# Patient Record
Sex: Female | Born: 1980 | Hispanic: Yes | Smoking: Never smoker
Health system: Southern US, Community
[De-identification: ages and names within clinical notes are randomized; demographics above are authoritative.]

## PROBLEM LIST (undated history)

## (undated) DIAGNOSIS — D496 Neoplasm of unspecified behavior of brain: Secondary | ICD-10-CM

## (undated) DIAGNOSIS — E079 Disorder of thyroid, unspecified: Secondary | ICD-10-CM

## (undated) HISTORY — PX: CHOLECYSTECTOMY: SHX55

## (undated) HISTORY — PX: APPENDECTOMY: SHX54

---

## 2014-04-19 ENCOUNTER — Encounter (HOSPITAL_COMMUNITY): Payer: Self-pay | Admitting: Emergency Medicine

## 2014-04-19 ENCOUNTER — Emergency Department (HOSPITAL_COMMUNITY)
Admission: EM | Admit: 2014-04-19 | Discharge: 2014-04-19 | Disposition: A | Discharge status: Home / Self Care | Payer: Self-pay | Attending: Emergency Medicine | Admitting: Emergency Medicine

## 2014-04-19 DIAGNOSIS — J029 Acute pharyngitis, unspecified: Secondary | ICD-10-CM | POA: Insufficient documentation

## 2014-04-19 DIAGNOSIS — Z8639 Personal history of other endocrine, nutritional and metabolic disease: Secondary | ICD-10-CM | POA: Insufficient documentation

## 2014-04-19 DIAGNOSIS — Z862 Personal history of diseases of the blood and blood-forming organs and certain disorders involving the immune mechanism: Secondary | ICD-10-CM | POA: Insufficient documentation

## 2014-04-19 HISTORY — DX: Disorder of thyroid, unspecified: E07.9

## 2014-04-19 LAB — RAPID STREP SCREEN (MED CTR MEBANE ONLY): Streptococcus, Group A Screen (Direct): NEGATIVE

## 2014-04-19 MED ORDER — HYDROCODONE-ACETAMINOPHEN 7.5-325 MG/15ML PO SOLN: 15.0000 mL | Freq: Three times a day (TID) | ORAL | Status: DC | PRN

## 2014-04-19 MED ORDER — AMOXICILLIN 500 MG PO CAPS: 500.0000 mg | ORAL_CAPSULE | Freq: Three times a day (TID) | ORAL | Status: DC

## 2014-04-19 NOTE — ED Provider Notes (Signed)
CSN: [id number]     Arrival date & time 04/19/14  1055 History  This chart was scribed for Margarita Mail, PA-C, non-physician practitioner working with Jasper Riling. Alvino Chapel, MD by Vernell Barrier, ED scribe. This patient was seen in room TR06C/TR06C and the patient's care was started at 1:02 PM.    Chief Complaint  Patient presents with  . Sore Throat   The history is provided by the patient and the spouse. A language interpreter was used.   HPI Comments: Kathryn Green is a 33 y.o. female who presents to the Emergency Department complaining of gradually worsening sore throat for the past 2 weeks; worsening as of 8 days ago. Reports difficulty drinking water.Pain with air against the back of the throat. Pain with swallowing but is able to. States lymph nodes in her neck are tender. Been managing pain with ibuprofen up to 5 pills daily with minimal to no relief. No hx of strep throat.  Past Medical History  Diagnosis Date  . Thyroid disease    Past Surgical History  Procedure Laterality Date  . Kidney stone surgery    . Cesarean section     No family history on file. History  Substance Use Topics  . Smoking status: Never Smoker   . Smokeless tobacco: Not on file  . Alcohol Use: No   OB History   Grav Para Term Preterm Abortions TAB SAB Ect Mult Living                 Review of Systems  Constitutional: Negative for fever and chills.  HENT: Positive for sore throat. Negative for congestion, dental problem, drooling, ear pain, mouth sores and trouble swallowing.   Respiratory: Negative for shortness of breath.   Cardiovascular: Negative for chest pain.  Gastrointestinal: Negative for nausea, vomiting, abdominal pain, diarrhea and constipation.  Genitourinary: Negative for dysuria and hematuria.  Musculoskeletal: Negative for arthralgias and myalgias.  Skin: Negative for rash.  Neurological: Negative for numbness.  All other systems reviewed and are negative.  Allergies   Review of patient's allergies indicates no known allergies.  Home Medications   Prior to Admission medications   Not on File   Triage vitals: BP 133/95  Pulse 107  Temp(Src) 98.5 F (36.9 C)  Resp 18  Ht 5\' 2"  (1.575 m)  Wt 200 lb (90.719 kg)  BMI 36.57 kg/m2  SpO2 98%  Physical Exam  Nursing note and vitals reviewed. Constitutional: She is oriented to person, place, and time. She appears well-developed and well-nourished. No distress.  HENT:  Head: Normocephalic and atraumatic.  Mouth/Throat: Uvula is midline. No trismus in the jaw. Uvula swelling present. Posterior oropharyngeal erythema present. No oropharyngeal exudate.  Swollen uvula and bilateral tonsillar adenopathy. No exudate. Erythema into posterior orophyranx. No trismus. No uvula deviation.   Eyes: Conjunctivae and EOM are normal.  Neck: Neck supple.  Cardiovascular: Normal rate, regular rhythm and normal heart sounds.   No murmur heard. Pulmonary/Chest: Effort normal and breath sounds normal. No respiratory distress. She has no wheezes. She has no rales.  Musculoskeletal: Normal range of motion.  Lymphadenopathy:    She has cervical adenopathy (painful, anterior).  Neurological: She is alert and oriented to person, place, and time.  Skin: Skin is warm and dry.  Psychiatric: She has a normal mood and affect. Her behavior is normal.    ED Course  Procedures (including critical care time) DIAGNOSTIC STUDIES: Oxygen Saturation is 98% on room air, normal by my interpretation.  COORDINATION OF CARE: At 1:09 PM: Discussed treatment plan with patient which includes treatment with antibiotics and pain medication. Encouraged to drink hot liquids and soups to soothe the throat. Patient agrees.    Labs Review Results for orders placed during the hospital encounter of 04/19/14  RAPID STREP SCREEN      Result Value Ref Range   Streptococcus, Group A Screen (Direct) NEGATIVE  NEGATIVE     Imaging Review No  results found.   EKG Interpretation None      MDM   Final diagnoses:  Pharyngitis   patient with pharyngitis persistent for to weeks .will treat with abx due to length of sxs.  f/u with pcp. Pain meds at discharge. Tolerating secretions. Return precautions discussed.  I personally performed the services described in this documentation, which was scribed in my presence. The recorded information has been reviewed and is accurate.     Margarita Mail, PA-C 04/21/14 2207

## 2014-04-19 NOTE — Discharge Instructions (Signed)

## 2014-04-19 NOTE — ED Notes (Signed)
Pt c/o sore throat for 8 days. Pt has tried ibuprofen for 1 week.

## 2014-04-21 LAB — CULTURE, GROUP A STREP

## 2014-04-22 NOTE — ED Provider Notes (Signed)
Medical screening examination/treatment/procedure(s) were performed by non-physician practitioner and as supervising physician I was immediately available for consultation/collaboration.   EKG Interpretation None       Jasper Riling. Alvino Chapel, MD 04/22/14 1327

## 2014-10-21 ENCOUNTER — Emergency Department (HOSPITAL_COMMUNITY): Payer: Self-pay

## 2014-10-21 ENCOUNTER — Emergency Department (HOSPITAL_COMMUNITY)
Admission: EM | Admit: 2014-10-21 | Discharge: 2014-10-21 | Disposition: A | Discharge status: Home / Self Care | Payer: Self-pay | Attending: Emergency Medicine | Admitting: Emergency Medicine

## 2014-10-21 ENCOUNTER — Encounter (HOSPITAL_COMMUNITY): Payer: Self-pay | Admitting: *Deleted

## 2014-10-21 DIAGNOSIS — M546 Pain in thoracic spine: Secondary | ICD-10-CM

## 2014-10-21 DIAGNOSIS — R0789 Other chest pain: Secondary | ICD-10-CM | POA: Insufficient documentation

## 2014-10-21 DIAGNOSIS — Y9389 Activity, other specified: Secondary | ICD-10-CM | POA: Insufficient documentation

## 2014-10-21 DIAGNOSIS — S3991XA Unspecified injury of abdomen, initial encounter: Secondary | ICD-10-CM | POA: Insufficient documentation

## 2014-10-21 DIAGNOSIS — Y998 Other external cause status: Secondary | ICD-10-CM | POA: Insufficient documentation

## 2014-10-21 DIAGNOSIS — S199XXA Unspecified injury of neck, initial encounter: Secondary | ICD-10-CM | POA: Insufficient documentation

## 2014-10-21 DIAGNOSIS — R1011 Right upper quadrant pain: Secondary | ICD-10-CM

## 2014-10-21 DIAGNOSIS — Z3202 Encounter for pregnancy test, result negative: Secondary | ICD-10-CM | POA: Insufficient documentation

## 2014-10-21 DIAGNOSIS — Z9049 Acquired absence of other specified parts of digestive tract: Secondary | ICD-10-CM | POA: Insufficient documentation

## 2014-10-21 DIAGNOSIS — S299XXA Unspecified injury of thorax, initial encounter: Secondary | ICD-10-CM | POA: Insufficient documentation

## 2014-10-21 DIAGNOSIS — Z8639 Personal history of other endocrine, nutritional and metabolic disease: Secondary | ICD-10-CM | POA: Insufficient documentation

## 2014-10-21 DIAGNOSIS — Y9289 Other specified places as the place of occurrence of the external cause: Secondary | ICD-10-CM | POA: Insufficient documentation

## 2014-10-21 DIAGNOSIS — W1839XA Other fall on same level, initial encounter: Secondary | ICD-10-CM | POA: Insufficient documentation

## 2014-10-21 LAB — CBC WITH DIFFERENTIAL/PLATELET
BASOS ABS: 0 10*3/uL (ref 0.0–0.1)
BASOS PCT: 0 % (ref 0–1)
EOS ABS: 0 10*3/uL (ref 0.0–0.7)
Eosinophils Relative: 0 % (ref 0–5)
HEMATOCRIT: 36.1 % (ref 36.0–46.0)
Hemoglobin: 12.3 g/dL (ref 12.0–15.0)
LYMPHS PCT: 32 % (ref 12–46)
Lymphs Abs: 2.3 10*3/uL (ref 0.7–4.0)
MCH: 31.3 pg (ref 26.0–34.0)
MCHC: 34.1 g/dL (ref 30.0–36.0)
MCV: 91.9 fL (ref 78.0–100.0)
MONO ABS: 0.4 10*3/uL (ref 0.1–1.0)
MONOS PCT: 6 % (ref 3–12)
NEUTROS PCT: 62 % (ref 43–77)
Neutro Abs: 4.5 10*3/uL (ref 1.7–7.7)
Platelets: 298 10*3/uL (ref 150–400)
RBC: 3.93 MIL/uL (ref 3.87–5.11)
RDW: 12.9 % (ref 11.5–15.5)
WBC: 7.3 10*3/uL (ref 4.0–10.5)

## 2014-10-21 LAB — LIPASE, BLOOD: LIPASE: 33 U/L (ref 11–59)

## 2014-10-21 LAB — URINALYSIS, ROUTINE W REFLEX MICROSCOPIC
Bilirubin Urine: NEGATIVE
Glucose, UA: NEGATIVE mg/dL
Hgb urine dipstick: NEGATIVE
Ketones, ur: NEGATIVE mg/dL
LEUKOCYTES UA: NEGATIVE
NITRITE: NEGATIVE
PH: 5.5 (ref 5.0–8.0)
Protein, ur: NEGATIVE mg/dL
Specific Gravity, Urine: 1.025 (ref 1.005–1.030)
Urobilinogen, UA: 0.2 mg/dL (ref 0.0–1.0)

## 2014-10-21 LAB — COMPREHENSIVE METABOLIC PANEL
ALBUMIN: 4.1 g/dL (ref 3.5–5.2)
ALT: 38 U/L — ABNORMAL HIGH (ref 0–35)
ANION GAP: 7 (ref 5–15)
AST: 43 U/L — AB (ref 0–37)
Alkaline Phosphatase: 89 U/L (ref 39–117)
BILIRUBIN TOTAL: 2 mg/dL — AB (ref 0.3–1.2)
BUN: 14 mg/dL (ref 6–23)
CALCIUM: 8.7 mg/dL (ref 8.4–10.5)
CO2: 28 mmol/L (ref 19–32)
Chloride: 105 mEq/L (ref 96–112)
Creatinine, Ser: 0.73 mg/dL (ref 0.50–1.10)
GFR calc Af Amer: >90 mL/min (ref >90)
GFR calc non Af Amer: >90 mL/min (ref >90)
Glucose, Bld: 95 mg/dL (ref 70–99)
POTASSIUM: 3.1 mmol/L — AB (ref 3.5–5.1)
SODIUM: 140 mmol/L (ref 135–145)
Total Protein: 7.8 g/dL (ref 6.0–8.3)

## 2014-10-21 LAB — POC URINE PREG, ED: Preg Test, Ur: NEGATIVE

## 2014-10-21 MED ORDER — HYDROMORPHONE HCL 1 MG/ML IJ SOLN
1.0000 mg | Freq: Once | INTRAMUSCULAR | Status: AC
  Administered 2014-10-21: 1 mg via INTRAMUSCULAR
  Filled 2014-10-21: qty 1

## 2014-10-21 MED ORDER — ONDANSETRON 4 MG PO TBDP
4.0000 mg | ORAL_TABLET | Freq: Once | ORAL | Status: AC
  Administered 2014-10-21: 4 mg via ORAL
  Filled 2014-10-21: qty 1

## 2014-10-21 MED ORDER — HYDROMORPHONE HCL 1 MG/ML IJ SOLN
1.0000 mg | Freq: Once | INTRAMUSCULAR | Status: AC
  Administered 2014-10-21: 1 mg via INTRAVENOUS
  Filled 2014-10-21: qty 1

## 2014-10-21 MED ORDER — ONDANSETRON HCL 4 MG/2ML IJ SOLN
4.0000 mg | Freq: Once | INTRAMUSCULAR | Status: DC
  Filled 2014-10-21: qty 2

## 2014-10-21 MED ORDER — MELOXICAM 15 MG PO TABS
15.0000 mg | ORAL_TABLET | Freq: Every day | ORAL | Status: AC
Start: 2014-10-21 — End: ?

## 2014-10-21 MED ORDER — ONDANSETRON HCL 4 MG/2ML IJ SOLN
4.0000 mg | Freq: Once | INTRAMUSCULAR | Status: AC
  Administered 2014-10-21: 4 mg via INTRAVENOUS

## 2014-10-21 MED ORDER — HYDROCODONE-ACETAMINOPHEN 7.5-325 MG PO TABS: 1.0000 | ORAL_TABLET | ORAL | Status: DC | PRN

## 2014-10-21 MED ORDER — BACLOFEN 10 MG PO TABS: 10.0000 mg | ORAL_TABLET | Freq: Three times a day (TID) | ORAL | Status: AC

## 2014-10-21 NOTE — ED Provider Notes (Signed)
Medical screening examination/treatment/procedure(s) were performed by non-physician practitioner and as supervising physician I was immediately available for consultation/collaboration.   EKG Interpretation None        Fredia Sorrow, MD 10/21/14 [phone]

## 2014-10-21 NOTE — Care Management Note (Signed)
ED/CM noted patient did not have health insurance and/or PCP listed in the computer.  Patient was given the [facility] with information on the clinics, food pantries, and the handout for new health insurance sign-up. Pt was also given a Rx discount card. Patient expressed appreciation for information received.

## 2014-10-21 NOTE — ED Provider Notes (Signed)
CSN: [id number]     Arrival date & time 10/21/14  3810 History   First MD Initiated Contact with Patient 10/21/14 [phone]     Chief Complaint  Patient presents with  . Abdominal Pain     (Consider location/radiation/quality/duration/timing/severity/associated sxs/prior Treatment) HPI Comments:  Patient is a 34 year old female who presents to the emergency department with complaint of right rib, abdomen, and back pain. The patient states that she fell from a standing position 4 days ago and injured this area. She's been having increasing pain since that time. She denies vomiting, and denies difficulty with breathing. She denies any changes in her stool, or urine, with her last bowel movement being yesterday January 19. The patient denies being on any anticoagulation medications. She's not had any operations or procedures involving the right chest or back. The pain is made worse with movement, particularly changing positions. Nothing seems to make the pain any better.  Patient is a 34 y.o. female presenting with chest pain. The history is provided by the patient.  Chest Pain Pain location:  R chest Pain radiates to:  Mid back Associated symptoms: abdominal pain   Associated symptoms: no back pain, no cough, no dizziness, no palpitations and no shortness of breath     Past Medical History  Diagnosis Date  . Thyroid disease    Past Surgical History  Procedure Laterality Date  . Kidney stone surgery    . Cesarean section    . Cholecystectomy     No family history on file. History  Substance Use Topics  . Smoking status: Never Smoker   . Smokeless tobacco: Not on file  . Alcohol Use: No   OB History    No data available     Review of Systems  Constitutional: Negative for activity change.       All ROS Neg except as noted in HPI  HENT: Negative.   Eyes: Negative for photophobia and discharge.  Respiratory: Negative for cough, shortness of breath and wheezing.   Cardiovascular:  Positive for chest pain. Negative for palpitations.  Gastrointestinal: Positive for abdominal pain. Negative for blood in stool.  Genitourinary: Negative for dysuria, frequency and hematuria.  Musculoskeletal: Negative for back pain, arthralgias and neck pain.  Skin: Negative.   Neurological: Negative for dizziness, seizures and speech difficulty.  Psychiatric/Behavioral: Negative for hallucinations and confusion.      Allergies  Review of patient's allergies indicates no known allergies.  Home Medications   Prior to Admission medications   Medication Sig Start Date End Date Taking? Authorizing Provider  ibuprofen (ADVIL,MOTRIN) 200 MG tablet Take 800 mg by mouth daily as needed for moderate pain.    Yes Historical Provider, MD  amoxicillin (AMOXIL) 500 MG capsule Take 1 capsule (500 mg total) by mouth 3 (three) times daily. Patient not taking: Reported on 10/21/2014 04/19/14   Margarita Mail, PA-C  HYDROcodone-acetaminophen (HYCET) 7.5-325 mg/15 ml solution Take 15 mLs by mouth every 8 (eight) hours as needed for moderate pain. Patient not taking: Reported on 10/21/2014 04/19/14   Margarita Mail, PA-C   BP 148/100 mmHg  Pulse 80  Temp(Src) 98.5 F (36.9 C) (Oral)  Resp 20  Ht 5\' 2"  (1.575 m)  Wt 200 lb (90.719 kg)  BMI 36.57 kg/m2  SpO2 99% Physical Exam  Constitutional: She is oriented to person, place, and time. She appears well-developed and well-nourished.  Non-toxic appearance.  HENT:  Head: Normocephalic.  Right Ear: Tympanic membrane and external ear normal.  Left Ear:  Tympanic membrane and external ear normal.  Eyes: EOM and lids are normal. Pupils are equal, round, and reactive to light.  Neck: Normal range of motion. Neck supple. Carotid bruit is not present.  Cardiovascular: Normal rate, regular rhythm, normal heart sounds, intact distal pulses and normal pulses.   Pulmonary/Chest: Breath sounds normal. No respiratory distress. She has no wheezes. She has no rales.  She exhibits tenderness.  Pain to palpation of the right anterior rib area, flank rib area, and posterior rib area near the thoracic region.  Abdominal: Soft. Bowel sounds are normal. There is tenderness in the right upper quadrant. There is guarding.    Musculoskeletal: Normal range of motion.  Pain to palpation of the mid to lower thoracic area. Severe pain with change of position from lying down to sitting. No palpable deformity or step-off appreciated.  Lymphadenopathy:       Head (right side): No submandibular adenopathy present.       Head (left side): No submandibular adenopathy present.    She has no cervical adenopathy.  Neurological: She is alert and oriented to person, place, and time. She has normal strength. No cranial nerve deficit or sensory deficit.  Skin: Skin is warm and dry.  Psychiatric: She has a normal mood and affect. Her speech is normal.  Nursing note and vitals reviewed.   ED Course  Procedures (including critical care time) Labs Review Labs Reviewed  URINALYSIS, ROUTINE W REFLEX MICROSCOPIC  POC URINE PREG, ED    Imaging Review No results found.   EKG Interpretation None      MDM  Vital signs non-acute. Cbc wnl. Cmet reveals low potassium of 3.1, Liver function elevated.. Lipase wnl. Urine wnl. No acute biliary problem on ultrasound. CT chest - No rib fx or thoracic fracture.  Test results given to the patient. Rx for baclofen, mobic, and norco given to the patient. Pt to follow up with her PCP.   Final diagnoses:  Thoracic back pain, unspecified back pain laterality    *I have reviewed nursing notes, vital signs, and all appropriate lab and imaging results for this patient.[address] Pryor Montes, PA-C 10/23/14 1734  Fredia Sorrow, MD 10/27/14 [phone]

## 2014-10-21 NOTE — ED Notes (Signed)
Pt fell Saturday and pain has been getting worse since then. Pt has gallbladder removal several years ago and pain hurts in that same area. Pt states it hurts to move. Pt states her stool has been yellow since Saturday, with last BM yesterday. NAD noted.

## 2014-10-27 ENCOUNTER — Emergency Department (HOSPITAL_COMMUNITY)
Admission: EM | Admit: 2014-10-27 | Discharge: 2014-10-27 | Discharge status: Against Medical Advice | Payer: Self-pay | Attending: Emergency Medicine | Admitting: Emergency Medicine

## 2014-10-27 ENCOUNTER — Encounter (HOSPITAL_COMMUNITY): Payer: Self-pay | Admitting: *Deleted

## 2014-10-27 DIAGNOSIS — R1011 Right upper quadrant pain: Secondary | ICD-10-CM | POA: Insufficient documentation

## 2014-10-27 NOTE — ED Notes (Signed)
Pt called for room with no response x3.

## 2014-10-27 NOTE — ED Notes (Signed)
Pt called for bed placement x1 with no response.

## 2014-10-27 NOTE — ED Notes (Signed)
Called second time for a room. No answer.

## 2014-10-27 NOTE — ED Notes (Signed)
Right upper quadrant pain for 2 months

## 2015-12-19 ENCOUNTER — Encounter (HOSPITAL_COMMUNITY): Payer: Self-pay | Admitting: *Deleted

## 2015-12-19 ENCOUNTER — Emergency Department (HOSPITAL_COMMUNITY)
Admission: EM | Admit: 2015-12-19 | Discharge: 2015-12-19 | Disposition: A | Discharge status: Home / Self Care | Payer: Self-pay | Attending: Emergency Medicine | Admitting: Emergency Medicine

## 2015-12-19 DIAGNOSIS — H9202 Otalgia, left ear: Secondary | ICD-10-CM

## 2015-12-19 DIAGNOSIS — J069 Acute upper respiratory infection, unspecified: Secondary | ICD-10-CM | POA: Insufficient documentation

## 2015-12-19 MED ORDER — AMOXICILLIN 250 MG PO CAPS
500.0000 mg | ORAL_CAPSULE | Freq: Once | ORAL | Status: AC
  Administered 2015-12-19: 500 mg via ORAL
  Filled 2015-12-19: qty 2

## 2015-12-19 MED ORDER — AMOXICILLIN 500 MG PO CAPS: 500.0000 mg | ORAL_CAPSULE | Freq: Three times a day (TID) | ORAL | Status: AC

## 2015-12-19 MED ORDER — ACETAMINOPHEN-CODEINE #3 300-30 MG PO TABS: 1.0000 | ORAL_TABLET | Freq: Four times a day (QID) | ORAL | Status: AC | PRN

## 2015-12-19 MED ORDER — IBUPROFEN 600 MG PO TABS: 600.0000 mg | ORAL_TABLET | Freq: Four times a day (QID) | ORAL | Status: DC | PRN

## 2015-12-19 MED ORDER — IBUPROFEN 800 MG PO TABS
800.0000 mg | ORAL_TABLET | Freq: Once | ORAL | Status: AC
  Administered 2015-12-19: 800 mg via ORAL
  Filled 2015-12-19: qty 1

## 2015-12-19 NOTE — Discharge Instructions (Signed)
Use salt water gargles three or 4 times daily. Use amoxil three times daily with food. Use ibuprofen every 6 hours as needed for pain or fever or chills. Use tylenol codeine for severe pain. This medication may cause drowsiness, use with caution. Wash hands frequently. Dolor de odos (Earache) El dolor de odos, tambin llamado otalgia, puede tener muchas causas. Puede ser agudo, sordo o ardiente, transitorio o Agricultural engineer. Los dolores de odos pueden deberse a problemas de los odos, como infecciones en el odo medio o el conducto auditivo externo, lesiones, tapones de cera, presin en el odo medio o un cuerpo extrao en el odo. Tambin, a problemas en otras zonas, lo que se conoce como dolor referido. Por ejemplo, el dolor puede deberse a una faringitis, una infeccin dental o problemas de la mandbula o de la articulacin que se encuentra entre la mandbula y el crneo (articulacin temporomandibular o ATM). No siempre es fcil identificar la causa de un dolor de odos. La observacin cautelosa puede ser la [place] en el caso de algunos dolores de odos, [place] se descubra una causa clara. INSTRUCCIONES PARA EL CUIDADO EN EL HOGAR Controle su afeccin para ver si hay cambios. Las siguientes medidas pueden servir para Public house manager cualquier molestia que est sintiendo:  Tome los medicamentos solamente como se lo haya indicado el mdico. Esto incluye la aplicacin de las gotas ticas.  Pngase hielo en la oreja para ayudar a Best boy.  Ponga el hielo en una bolsa plstica.  Coloque una toalla entre la piel y la bolsa de hielo.  Coloque el hielo durante 43mnutos, 2 a 3veces por dTraining and development officer  No se ponga nada en el odo que no sean los medicamentos que eSpecial educational needs teacher  Intente descansar en posicin erguida, en lugar de recostarse. Esto puede ayudar a reducir la presin en el odo medio y aBest boy  Mastique goma de mascar si esto ayuda a aBest boyde  odos.  Controle cualquier alergia que tenga.  Concurra a todas las visitas de control como se lo haya indicado el mdico. Esto es importante. SOLICITE ATENCIN MDICA SI:  El dolor no mejora en el trmino de 2das.  Tiene fiebre.  Tiene sntomas nuevos o estos empeoran. SOLICITE ATENCIN MDICA DE INMEDIATO SI:  Sufre un dolor intenso de [place]  Presenta rigidez en el cuello.  Tiene dificultad para tragar.  Hay enrojecimiento o hinchazn detrs de la oreja.  Tiene secrecin del odo.  Tiene prdida de la audicin.  Siente mareos.   Esta informacin no tiene cMarine scientistel consejo del mdico. Asegrese de hacerle al mdico cualquier pregunta que tenga.   Document Released: 12/26/2007 Document Revised: 10/09/2014 Elsevier Interactive Patient Education [employer]

## 2015-12-19 NOTE — ED Notes (Signed)
Pt began having left ear pain starting 3 days ago. Pt states she has had some throat pain as well. NAD noted in triage.

## 2015-12-19 NOTE — ED Provider Notes (Signed)
History  By signing my name below, I, Marlowe Kays, attest that this documentation has been prepared under the direction and in the presence of Lily Kocher, PA-C. Electronically Signed: Marlowe Kays, ED Scribe. 12/19/2015. 4:15 PM.  Chief Complaint  Patient presents with  . Otalgia   The history is provided by the patient and medical records. A language interpreter was used.    HPI Comments:  Kathryn Green is a 35 y.o. female who presents to the Emergency Department complaining of left ear pain that began two days ago. She states the underside of her left jaw began yesterday. She reports associated subjective fever and sore throat. Pt reports having sick contacts of someone in her household. She has taken Tylenol for the pain with minimal relief of the pain. She denies modifying factors. She denies dental pain, nausea, vomiting, diarrhea, rash. She denies any procedures on her ears.  Past Medical History  Diagnosis Date  . Thyroid disease    Past Surgical History  Procedure Laterality Date  . Kidney stone surgery    . Cesarean section    . Cholecystectomy     No family history on file. Social History  Substance Use Topics  . Smoking status: Never Smoker   . Smokeless tobacco: None  . Alcohol Use: No   OB History    No data available     Review of Systems  HENT: Positive for congestion, ear pain and sore throat.   All other systems reviewed and are negative.   Allergies  Review of patient's allergies indicates no known allergies.  Home Medications   Prior to Admission medications   Medication Sig Start Date End Date Taking? Authorizing Provider  amoxicillin (AMOXIL) 500 MG capsule Take 1 capsule (500 mg total) by mouth 3 (three) times daily. Patient not taking: Reported on 10/21/2014 04/19/14   Margarita Mail, PA-C  HYDROcodone-acetaminophen (NORCO) 7.5-325 MG per tablet Take 1 tablet by mouth every 4 (four) hours as needed. 10/21/14   Lily Kocher, PA-C   ibuprofen (ADVIL,MOTRIN) 200 MG tablet Take 800 mg by mouth daily as needed for moderate pain.     Historical Provider, MD  meloxicam (MOBIC) 15 MG tablet Take 1 tablet (15 mg total) by mouth daily. 10/21/14   Lily Kocher, PA-C   Triage Vitals: BP 159/107 mmHg  Pulse 103  Temp(Src) 98.6 F (37 C) (Oral)  Resp 16  Ht 5\' 2"  (1.575 m)  Wt 200 lb (90.719 kg)  BMI 36.57 kg/m2  SpO2 100% Physical Exam  Constitutional: She is oriented to person, place, and time. She appears well-developed and well-nourished.  HENT:  Head: Normocephalic and atraumatic.  Right Ear: Tympanic membrane and ear canal normal.  Left Ear: Tympanic membrane is erythematous and bulging.  Mouth/Throat: Posterior oropharyngeal erythema present.  Redness and mild bulging of left TM. No redness or swelling involving right or left *mastitis**. Nasal congestion present. Mild swelling of left tonsillar area. Increased redness of posterior oropharynx.  Eyes: EOM are normal.  Neck: Normal range of motion.  Cardiovascular: Normal rate, regular rhythm and normal heart sounds.  Exam reveals no gallop and no friction rub.   No murmur heard. Pulmonary/Chest: Effort normal and breath sounds normal. No respiratory distress. She has no wheezes. She has no rales.  Musculoskeletal: Normal range of motion.  Neurological: She is alert and oriented to person, place, and time.  Skin: Skin is warm and dry. No rash noted.  Psychiatric: She has a normal mood and affect. Her behavior  is normal.  Nursing note and vitals reviewed.   ED Course  Procedures (including critical care time) DIAGNOSTIC STUDIES: Oxygen Saturation is 100% on RA, normal by my interpretation.   COORDINATION OF CARE: 4:15 PM- Encouraged pt to use salt water gargles. Will prescribe Amoxicillin, Ibuprofen and Tylenol with Codeine for severe pain. Pt verbalizes understanding and agrees to plan.  Medications - No data to display  Labs Review Labs Reviewed - No data  to display  Imaging Review No results found. I have personally reviewed and evaluated these images and lab results as part of my medical decision-making.   EKG Interpretation None      MDM Exam favors otitis media and uri. No evidence for more severe infections. Pt is awake and alert, in no distress. D/C instructions given by Spanish interpreter video assistance.   Final diagnoses:  None    **I have reviewed nursing notes, vital signs, and all appropriate lab and imaging results for this patient.*  I personally performed the services described in this documentation, which was scribed in my presence. The recorded information has been reviewed and is accurate.    Lily Kocher, PA-C 12/19/15 [place], MD 12/23/15 2232

## 2016-02-24 IMAGING — US US ABDOMEN LIMITED
1 series · 14 of 25 positions shown · non-contrast
Comparison: None.

CLINICAL DATA: History of gallbladder surgery with elevated LFTs.
Recent fall and pain is getting worse. Pain is in the right upper
quadrant.

EXAM:
US ABDOMEN LIMITED - RIGHT UPPER QUADRANT

[Series 1: us abdomen limited · 0.23mm/px · 14 of 51 slices shown]
[im 1/51]
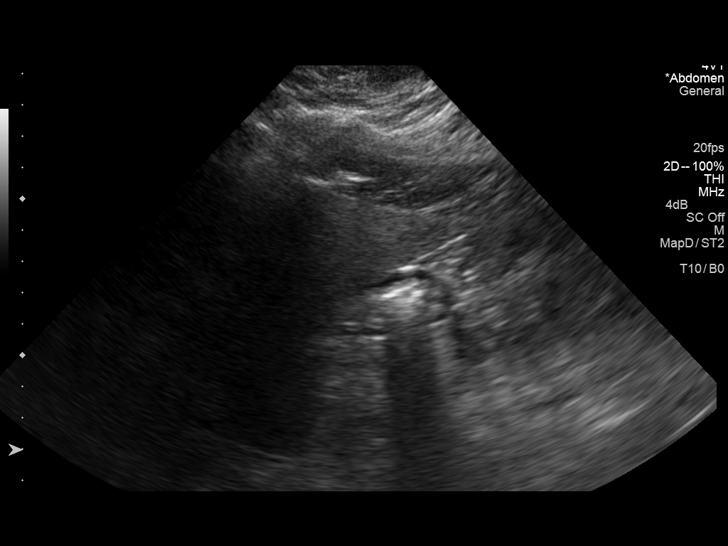
[im 5/51]
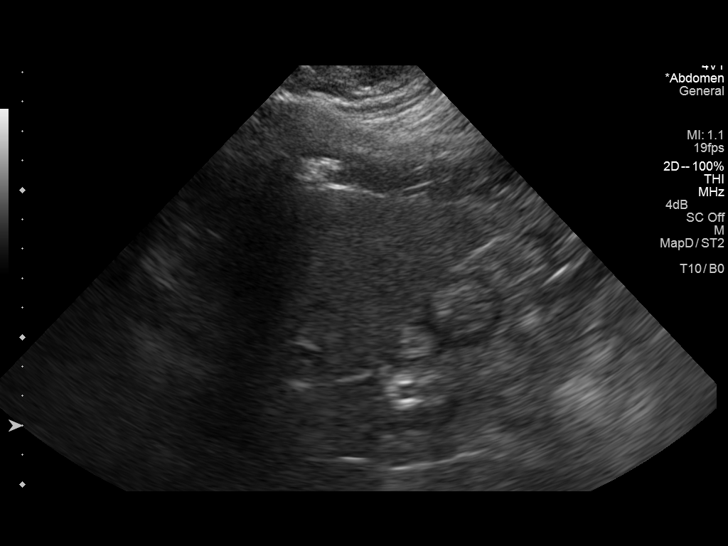
[im 9/51]
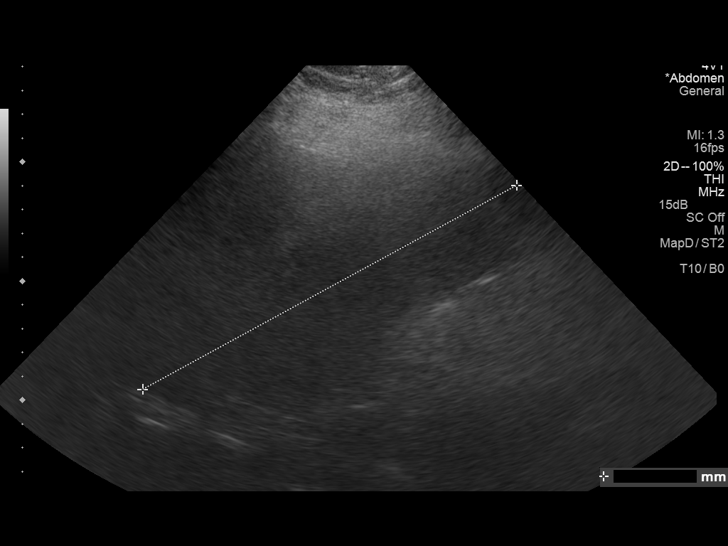
[im 13/51]
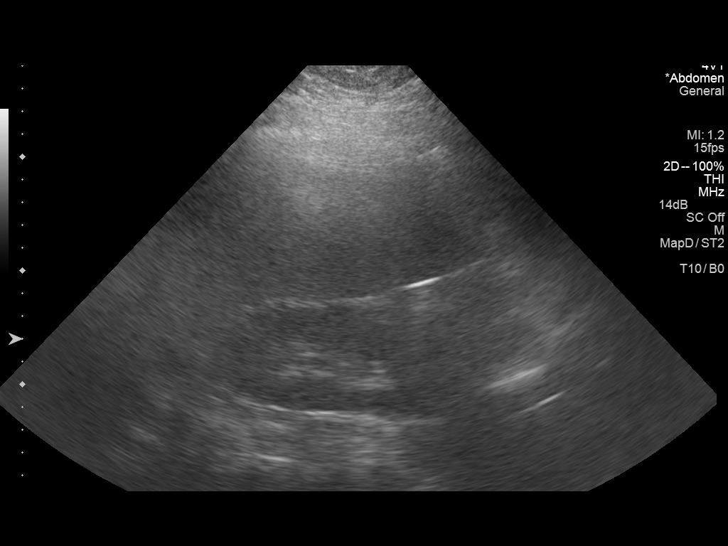
[im 17/51]
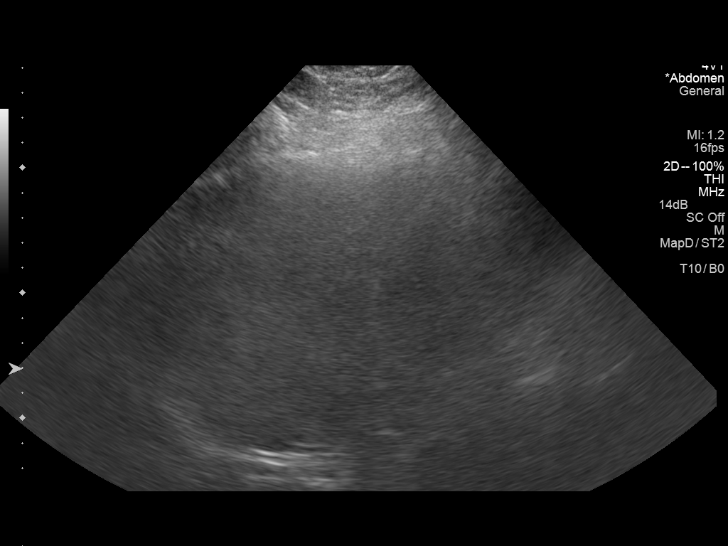
[im 19/51]
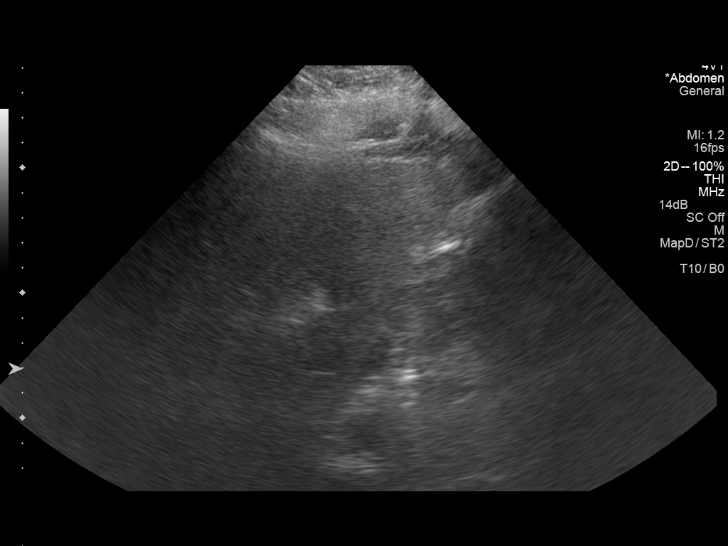
[im 23/51]
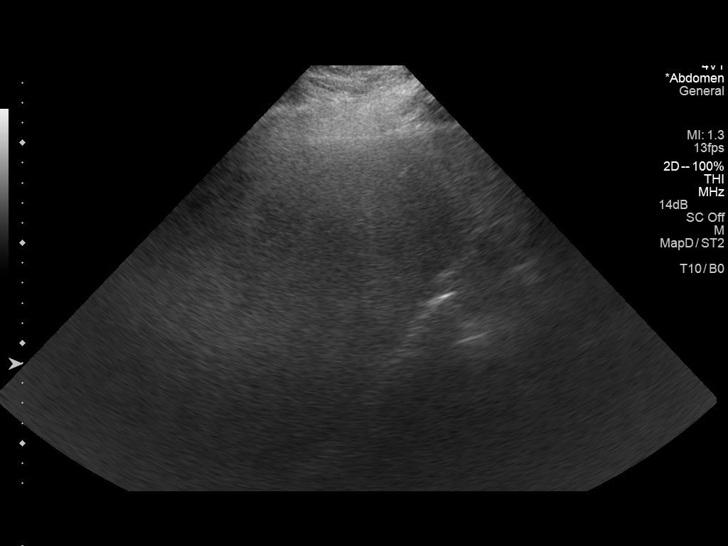
[im 28/51]
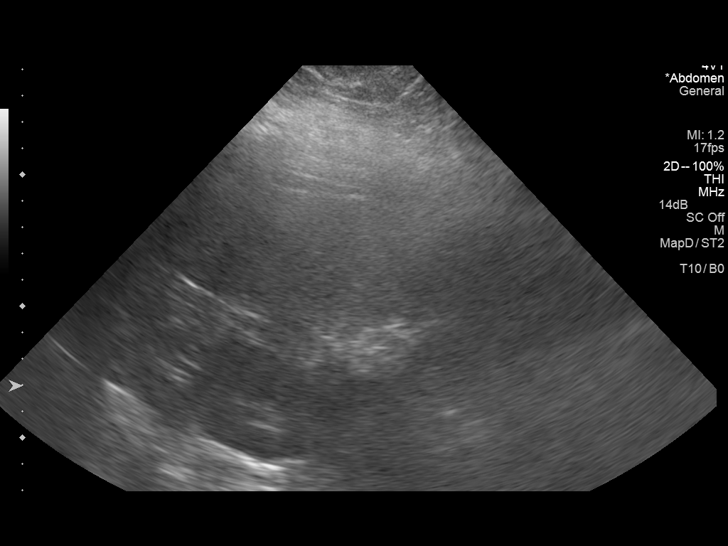
[im 32/51]
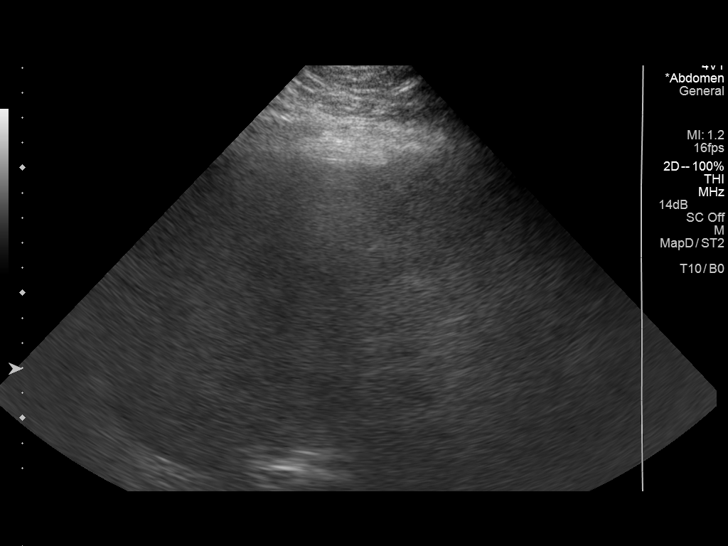
[im 34/51]
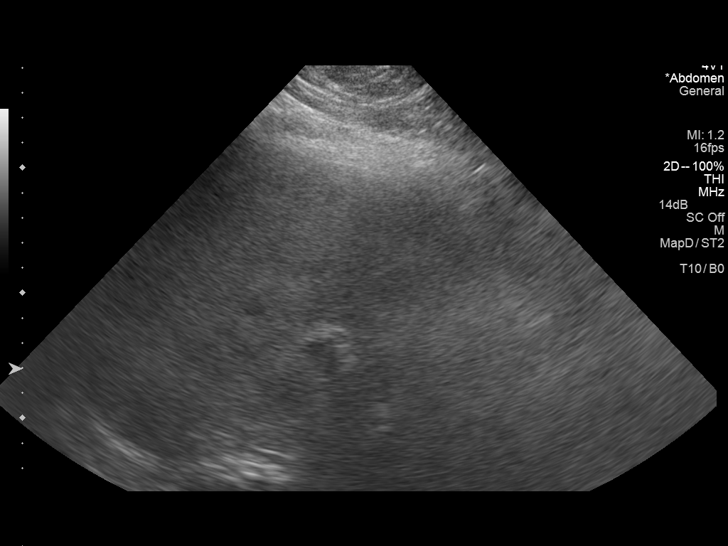
[im 38/51]
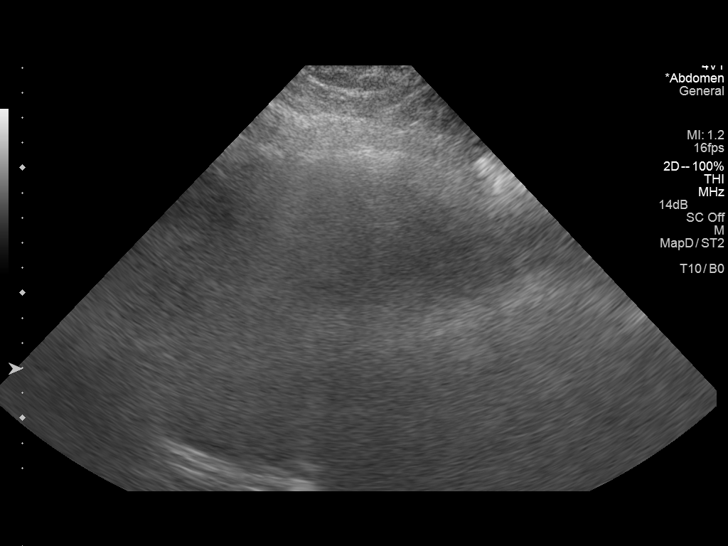
[im 42/51]
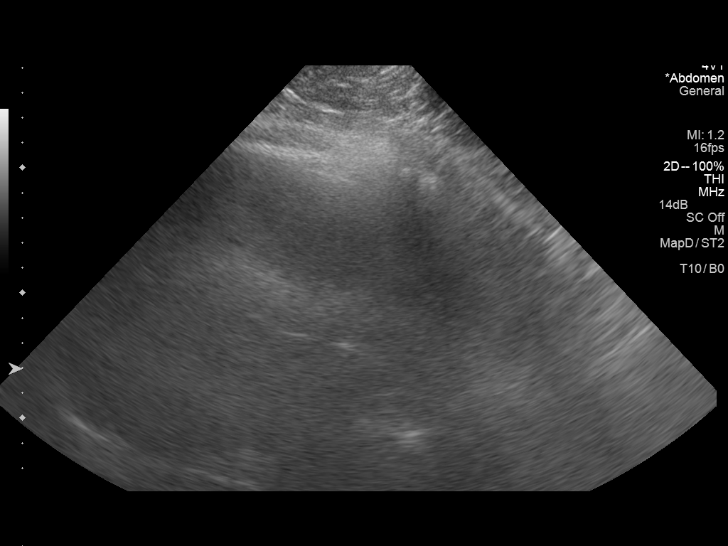
[im 46/51]
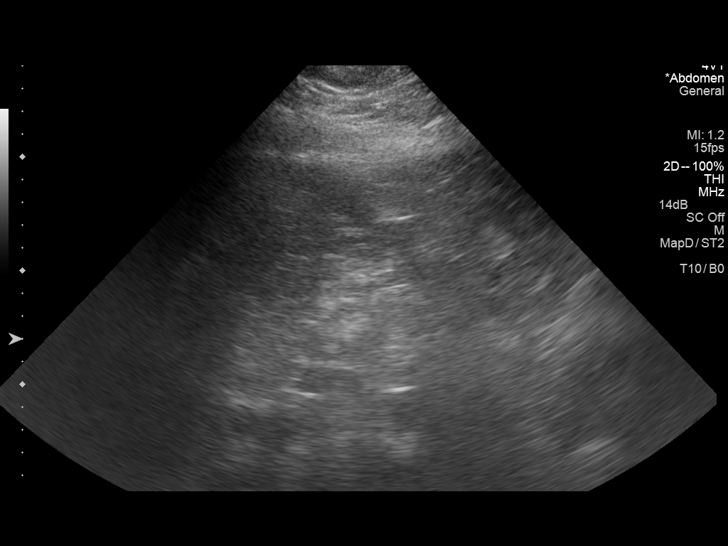
[im 51/51]
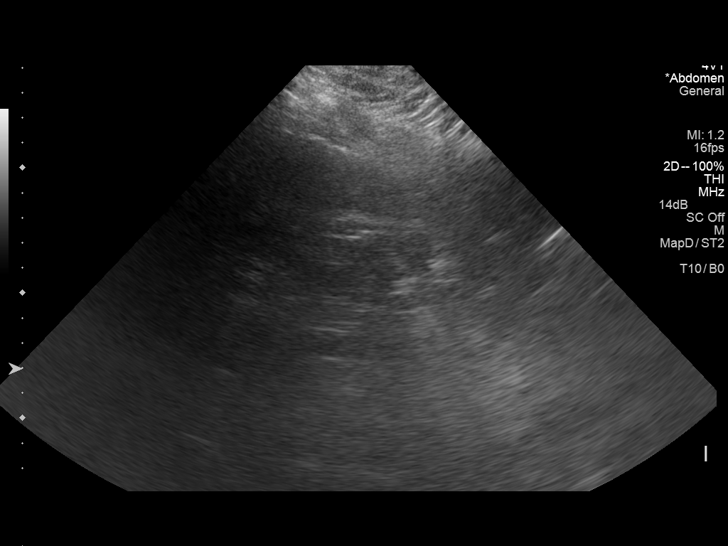

[14 of 25 positions shown; findings below may reference images not displayed]

FINDINGS: Gallbladder:

Surgically removed.

Common bile duct:

Diameter: 7 mm

Liver:

Liver is poorly characterized. The liver parenchyma is heterogeneous
and could represent hepatic steatosis. Internal architecture of the
liver is poorly characterized. Limited evaluation for liver lesions.
Main portal vein is patent.
IMPRESSION: The liver is poorly characterized on this examination. Findings
could be related to hepatic steatosis and/or body habitus. If there
is concern for traumatic injury to the liver, recommend further
characterization with an abdominal CT with intravenous contrast.

No evidence for biliary dilatation.

## 2016-02-24 IMAGING — CT CT CHEST W/O CM
2 of 6 series · 15 of 36 positions shown, 19 images · non-contrast
Comparison: None.

CLINICAL DATA: Fall, right chest and back pain.

EXAM:
CT CHEST WITHOUT CONTRAST
TECHNIQUE: Multidetector CT imaging of the chest was performed following the
standard protocol without IV contrast..

[Series 3: chestroutine 5.0 b40f · axial · 0.65mm/px · z∈[-255,-55]mm · 14 of 46 slices shown, 18 images]
[im 3/46  mediastinal]
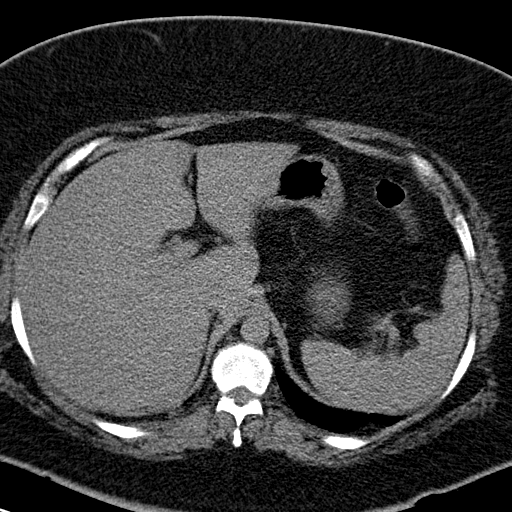
[im 3/46  lung]
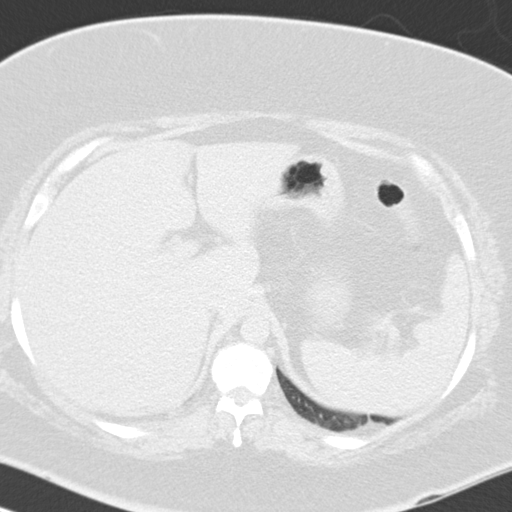
[im 7/46  lung]
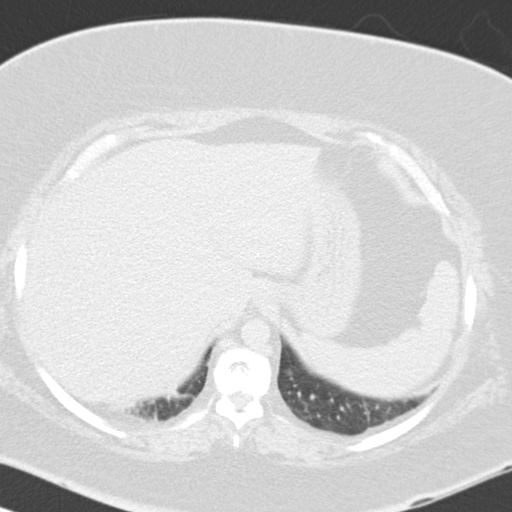
[im 9/46  lung]
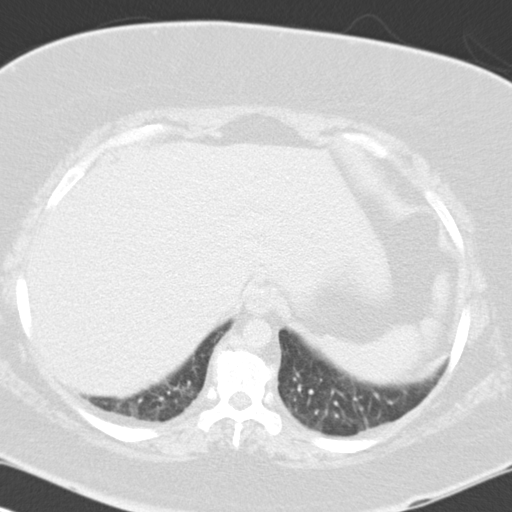
[im 13/46  lung]
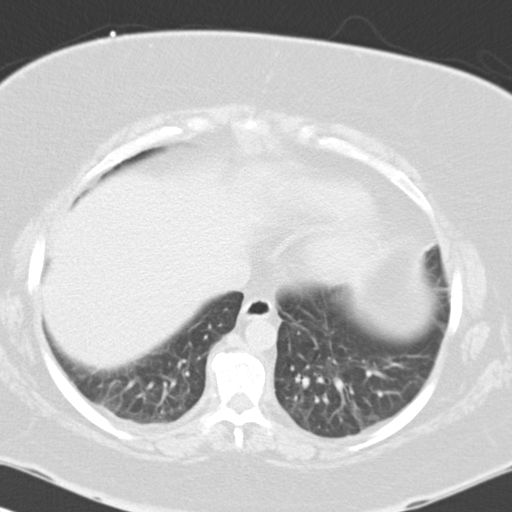
[im 15/46  mediastinal]
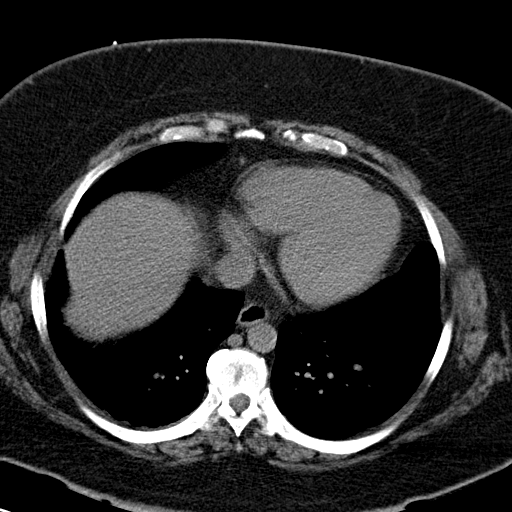
[im 15/46  lung]
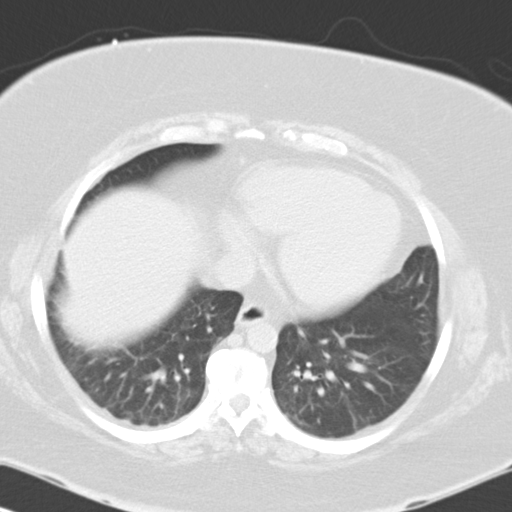
[im 19/46  lung]
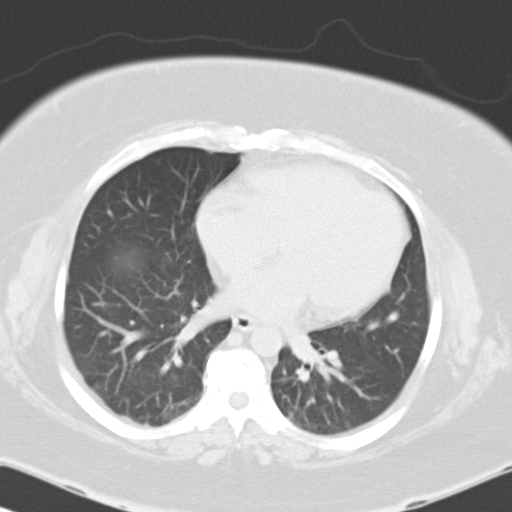
[im 21/46  lung]
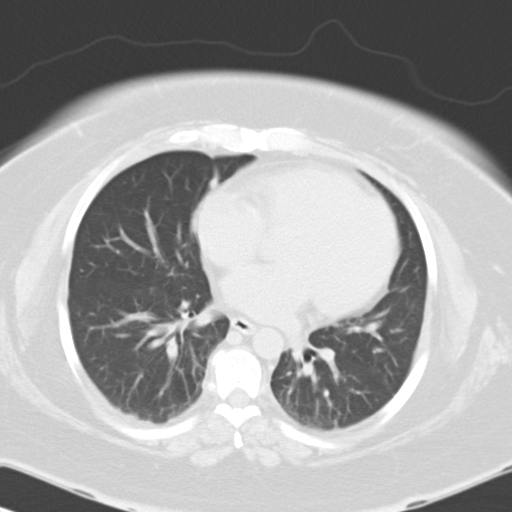
[im 25/46  lung]
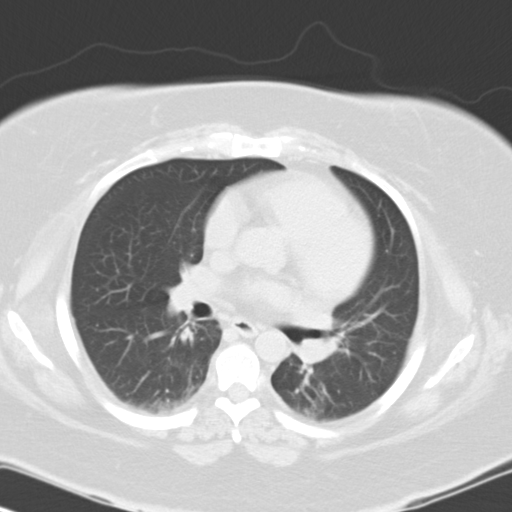
[im 27/46  mediastinal]
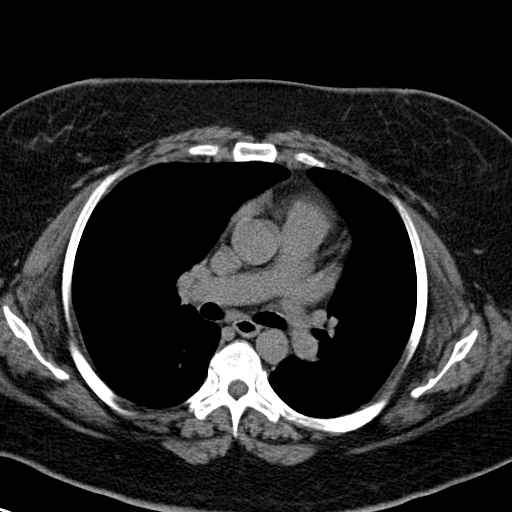
[im 27/46  lung]
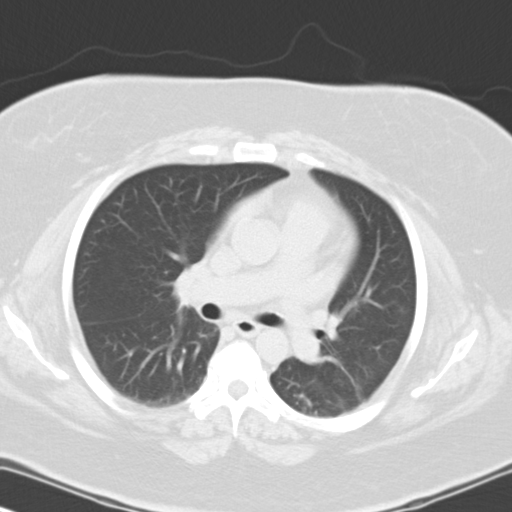
[im 31/46  lung]
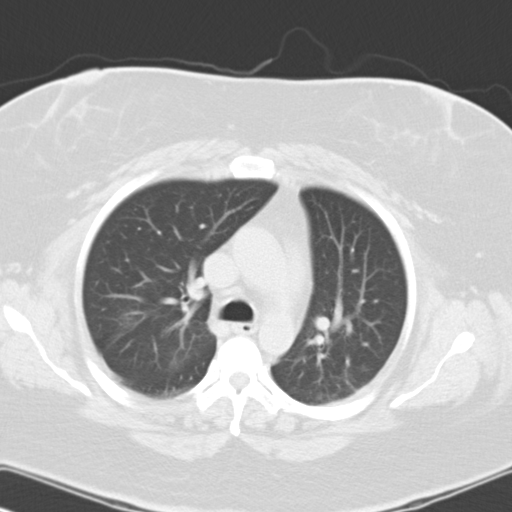
[im 33/46  lung]
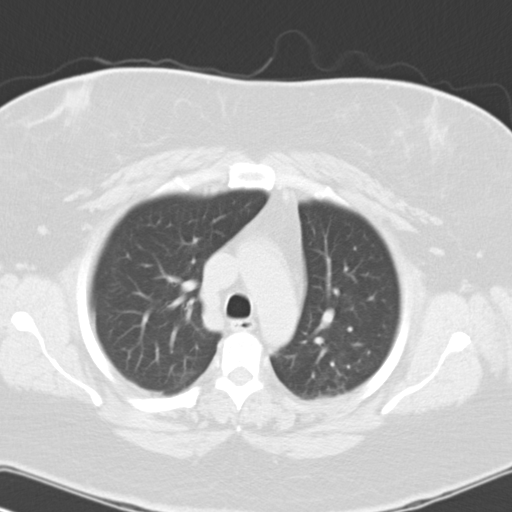
[im 37/46  lung]
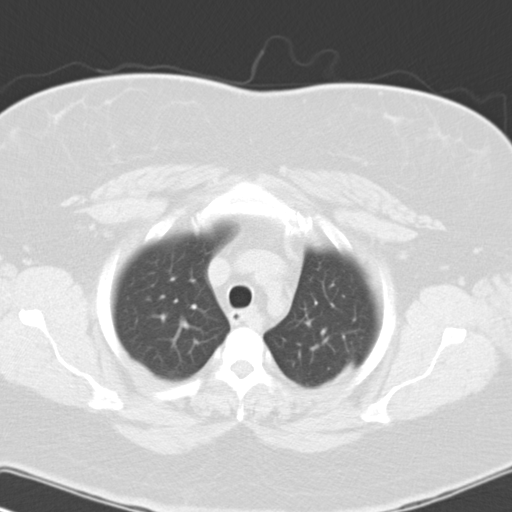
[im 39/46  mediastinal]
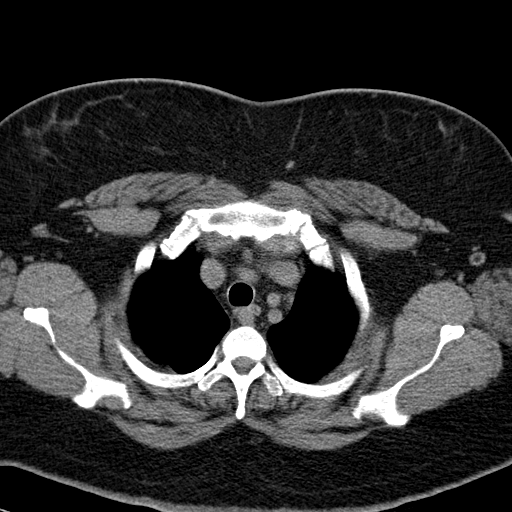
[im 39/46  lung]
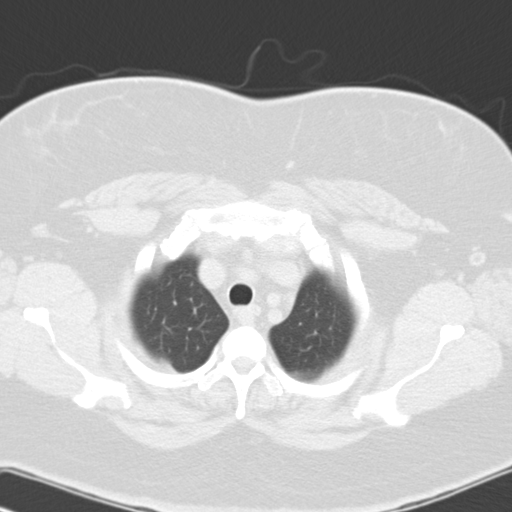
[im 43/46  lung]
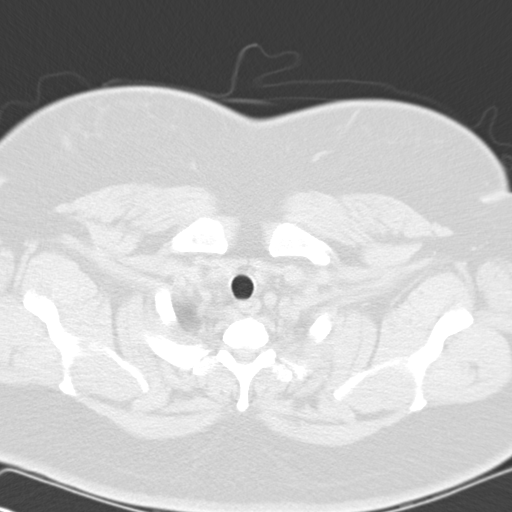

[Series 5: mpr coro 3mm · coronal · 0.44mm/px · 1 of 73 slices shown]
[im 37/73  lung]
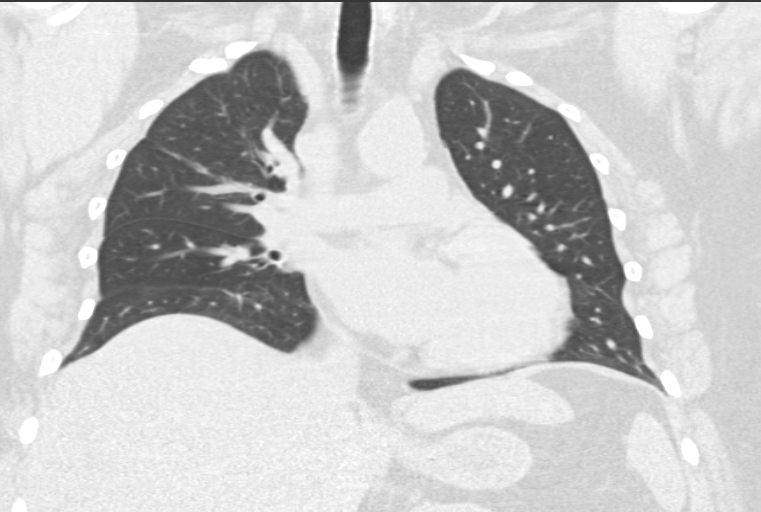

[15 of 36 positions shown; findings below may reference images not displayed]

FINDINGS: Calcified granuloma noted in the right upper lobe. Lungs are
otherwise clear. No pleural effusions. Heart is normal size. Aorta
is normal caliber. No mediastinal, hilar, or axillary adenopathy.
Chest wall soft tissues are unremarkable.

Review of the lung windows demonstrates no thoracic spine
compression fracture other acute bony abnormality. No visible rib
fracture. Mild degenerative spurring in the thoracic spine.

Imaging into the upper abdomen shows no acute findings.
IMPRESSION: No acute bony abnormality. No evidence of rib fracture or T spine
fracture.

Old granulomatous disease.

No acute cardiopulmonary disease.

## 2017-01-09 ENCOUNTER — Ambulatory Visit (INDEPENDENT_AMBULATORY_CARE_PROVIDER_SITE_OTHER): Payer: Self-pay | Admitting: Emergency Medicine

## 2017-01-09 VITALS — BP 133/90 | HR 89 | Temp 99.2°F | Resp 16 | Ht 60.5 in | Wt 249.4 lb

## 2017-01-09 DIAGNOSIS — R0981 Nasal congestion: Secondary | ICD-10-CM

## 2017-01-09 DIAGNOSIS — J310 Chronic rhinitis: Secondary | ICD-10-CM

## 2017-01-09 DIAGNOSIS — T485X1A Poisoning by other anti-common-cold drugs, accidental (unintentional), initial encounter: Secondary | ICD-10-CM

## 2017-01-09 DIAGNOSIS — J309 Allergic rhinitis, unspecified: Secondary | ICD-10-CM

## 2017-01-09 DIAGNOSIS — T485X5A Adverse effect of other anti-common-cold drugs, initial encounter: Secondary | ICD-10-CM

## 2017-01-09 MED ORDER — PREDNISONE 20 MG PO TABS: 20.0000 mg | ORAL_TABLET | Freq: Every day | ORAL | 0 refills | Status: AC

## 2017-01-09 NOTE — Patient Instructions (Addendum)
   IF you received an x-ray today, you will receive an invoice from [facility]. Please contact  Radiology at [phone] with questions or concerns regarding your invoice.   IF you received labwork today, you will receive an invoice from [facility]. Please contact [facility] at [phone] with questions or concerns regarding your invoice.   Our billing staff will not be able to assist you with questions regarding bills from these companies.  You will be contacted with the lab results as soon as they are available. The fastest way to get your results is to activate your My Chart account. Instructions are located on the last page of this paperwork. If you have not heard from us regarding the results in 2 weeks, please contact this office.     Allergic Rhinitis Allergic rhinitis is when the mucous membranes in the nose respond to allergens. Allergens are particles in the air that cause your body to have an allergic reaction. This causes you to release allergic antibodies. Through a chain of events, these eventually cause you to release histamine into the blood stream. Although meant to protect the body, it is this release of histamine that causes your discomfort, such as frequent sneezing, congestion, and an itchy, runny nose. What are the causes? Seasonal allergic rhinitis (hay fever) is caused by pollen allergens that may come from grasses, trees, and weeds. Year-round allergic rhinitis (perennial allergic rhinitis) is caused by allergens such as house dust mites, pet dander, and mold spores. What are the signs or symptoms?  Nasal stuffiness (congestion).  Itchy, runny nose with sneezing and tearing of the eyes. How is this diagnosed? Your health care provider can help you determine the allergen or allergens that trigger your symptoms. If you and your health care provider are unable to determine the allergen, skin or blood testing may be used. Your health care provider will  diagnose your condition after taking your health history and performing a physical exam. Your health care provider may assess you for other related conditions, such as asthma, pink eye, or an ear infection. How is this treated? Allergic rhinitis does not have a cure, but it can be controlled by:  Medicines that block allergy symptoms. These may include allergy shots, nasal sprays, and oral antihistamines.  Avoiding the allergen. Hay fever may often be treated with antihistamines in pill or nasal spray forms. Antihistamines block the effects of histamine. There are over-the-counter medicines that may help with nasal congestion and swelling around the eyes. Check with your health care provider before taking or giving this medicine. If avoiding the allergen or the medicine prescribed do not work, there are many new medicines your health care provider can prescribe. Stronger medicine may be used if initial measures are ineffective. Desensitizing injections can be used if medicine and avoidance does not work. Desensitization is when a patient is given ongoing shots until the body becomes less sensitive to the allergen. Make sure you follow up with your health care provider if problems continue. Follow these instructions at home: It is not possible to completely avoid allergens, but you can reduce your symptoms by taking steps to limit your exposure to them. It helps to know exactly what you are allergic to so that you can avoid your specific triggers. Contact a health care provider if:  You have a fever.  You develop a cough that does not stop easily (persistent).  You have shortness of breath.  You start wheezing.  Symptoms interfere with normal daily activities.   This information is not intended to replace advice given to you by your health care provider. Make sure you discuss any questions you have with your health care provider. Document Released: 06/13/2001 Document Revised: 05/19/2016 Document  Reviewed: 05/26/2013 Elsevier Interactive Patient Education  2017 Elsevier Inc.  

## 2017-01-09 NOTE — Progress Notes (Signed)
Kathryn Green 36 y.o.   Chief Complaint  Patient presents with  . Nasal Congestion    FOR DAYS  . Ear Pain    HISTORY OF PRESENT ILLNESS: This is a 36 y.o. female complaining of chronic nasal congestion x 2-3 years. No other significant symptoms. 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  HPI   Prior to Admission medications   Medication Sig Start Date End Date Taking? Authorizing Provider  ibuprofen (ADVIL,MOTRIN) 600 MG tablet Take 1 tablet (600 mg total) by mouth every 6 (six) hours as needed. 12/19/15  Yes Lily Kocher, PA-C  acetaminophen-codeine (TYLENOL #3) 300-30 MG tablet Take 1-2 tablets by mouth every 6 (six) hours as needed for moderate pain. Patient not taking: Reported on 01/09/2017 12/19/15   Lily Kocher, PA-C  amoxicillin (AMOXIL) 500 MG capsule Take 1 capsule (500 mg total) by mouth 3 (three) times daily. 12/19/15   Lily Kocher, PA-C  HYDROcodone-acetaminophen (NORCO) 7.5-325 MG per tablet Take 1 tablet by mouth every 4 (four) hours as needed. Patient not taking: Reported on 01/09/2017 10/21/14   Lily Kocher, PA-C  meloxicam (MOBIC) 15 MG tablet Take 1 tablet (15 mg total) by mouth daily. Patient not taking: Reported on 01/09/2017 10/21/14   Lily Kocher, PA-C    No Known Allergies  There are no active problems to display for this patient.   Past Medical History:  Diagnosis Date  . Thyroid disease     Past Surgical History:  Procedure Laterality Date  . APPENDECTOMY    . CESAREAN SECTION    . CHOLECYSTECTOMY    . KIDNEY STONE SURGERY      Social History   Social History  . Marital status: Significant Other    Spouse name: N/A  . Number of children: N/A  . Years of education: N/A   Occupational History  . Not on file.   Social History Main Topics  . Smoking status: Never Smoker  . Smokeless tobacco: Never Used  . Alcohol use No     Comment: SOMETIMES  . Drug use: No  . Sexual activity: Not on file   Other Topics  Concern  . Not on file   Social History Narrative  . No narrative on file    History reviewed. No pertinent family history.   Review of Systems  Constitutional: Negative.  Negative for chills and fever.  HENT: Positive for congestion, ear pain and sinus pain. Negative for ear discharge, nosebleeds and sore throat.   Eyes: Negative for blurred vision and double vision.  Respiratory: Negative for cough and shortness of breath.   Cardiovascular: Negative for chest pain, palpitations and leg swelling.  Gastrointestinal: Negative for abdominal pain, diarrhea, nausea and vomiting.  Genitourinary: Negative for dysuria and hematuria.  Musculoskeletal: Negative for back pain, myalgias and neck pain.  Skin: Negative.  Negative for rash.  Neurological: Negative for dizziness and headaches.  Endo/Heme/Allergies: Negative.   All other systems reviewed and are negative.   Vitals:   01/09/17 1235  BP: 133/90  Pulse: 89  Resp: 16  Temp: 99.2 F (37.3 C)    Physical Exam  Constitutional: She is oriented to person, place, and time. She appears well-developed.  Obese  HENT:  Head: Normocephalic and atraumatic.  Nose: Mucosal edema present. No rhinorrhea or sinus tenderness. No epistaxis.  Mouth/Throat: Oropharynx is clear and moist. No oropharyngeal exudate.  Eyes: Conjunctivae and EOM are normal. Pupils are equal, round, and reactive to light.  Neck:  Normal range of motion. Neck supple.  Cardiovascular: Normal rate, regular rhythm and normal heart sounds.   Pulmonary/Chest: Effort normal and breath sounds normal.  Abdominal: Soft. Bowel sounds are normal. She exhibits no distension. There is no tenderness.  Musculoskeletal: Normal range of motion.  Neurological: She is alert and oriented to person, place, and time. No sensory deficit. She exhibits normal muscle tone.  Skin: Skin is warm and dry. Capillary refill takes less than 2 seconds. No rash noted.  Psychiatric: She has a normal  mood and affect. Her behavior is normal.  Vitals reviewed.    ASSESSMENT & PLAN: Kathryn Green was seen today for nasal congestion and ear pain.  Diagnoses and all orders for this visit:  Chronic allergic rhinitis, unspecified seasonality, unspecified trigger  Nasal congestion  Rhinitis medicamentosa  Other orders -     predniSONE (DELTASONE) 20 MG tablet; Take 1 tablet (20 mg total) by mouth daily with breakfast.   Patient Instructions       IF you received an x-ray today, you will receive an invoice from [facility]. Please contact [facility] at [phone] with questions or concerns regarding your invoice.   IF you received labwork today, you will receive an invoice from [facility]. Please contact [facility] at [phone] with questions or concerns regarding your invoice.   Our billing staff will not be able to assist you with questions regarding bills from these companies.  You will be contacted with the lab results as soon as they are available. The fastest way to get your results is to activate your My Chart account. Instructions are located on the last page of this paperwork. If you have not heard from [place] regarding the results in 2 weeks, please contact this office.    Allergic Rhinitis Allergic rhinitis is when the mucous membranes in the nose respond to allergens. Allergens are particles in the air that cause your body to have an allergic reaction. This causes you to release allergic antibodies. Through a chain of events, these eventually cause you to release histamine into the blood stream. Although meant to protect the body, it is this release of histamine that causes your discomfort, such as frequent sneezing, congestion, and an itchy, runny nose. What are the causes? Seasonal allergic rhinitis (hay fever) is caused by pollen allergens that may come from grasses, trees, and weeds. Year-round allergic rhinitis (perennial allergic rhinitis) is caused by  allergens such as house dust mites, pet dander, and mold spores. What are the signs or symptoms?  Nasal stuffiness (congestion).  Itchy, runny nose with sneezing and tearing of the eyes. How is this diagnosed? Your health care provider can help you determine the allergen or allergens that trigger your symptoms. If you and your health care provider are unable to determine the allergen, skin or blood testing may be used. Your health care provider will diagnose your condition after taking your health history and performing a physical exam. Your health care provider may assess you for other related conditions, such as asthma, pink eye, or an ear infection. How is this treated? Allergic rhinitis does not have a cure, but it can be controlled by:  Medicines that block allergy symptoms. These may include allergy shots, nasal sprays, and oral antihistamines.  Avoiding the allergen. Hay fever may often be treated with antihistamines in pill or nasal spray forms. Antihistamines block the effects of histamine. There are over-the-counter medicines that may help with nasal congestion and swelling around the eyes. Check with your health  care provider before taking or giving this medicine. If avoiding the allergen or the medicine prescribed do not work, there are many new medicines your health care provider can prescribe. Stronger medicine may be used if initial measures are ineffective. Desensitizing injections can be used if medicine and avoidance does not work. Desensitization is when a patient is given ongoing shots until the body becomes less sensitive to the allergen. Make sure you follow up with your health care provider if problems continue. Follow these instructions at home: It is not possible to completely avoid allergens, but you can reduce your symptoms by taking steps to limit your exposure to them. It helps to know exactly what you are allergic to so that you can avoid your specific  triggers. Contact a health care provider if:  You have a fever.  You develop a cough that does not stop easily (persistent).  You have shortness of breath.  You start wheezing.  Symptoms interfere with normal daily activities. This information is not intended to replace advice given to you by your health care provider. Make sure you discuss any questions you have with your health care provider. Document Released: 06/13/2001 Document Revised: 05/19/2016 Document Reviewed: 05/26/2013 Elsevier Interactive Patient Education  2017 Elsevier Inc.      Agustina Caroli, MD [facility]

## 2017-02-07 ENCOUNTER — Encounter (HOSPITAL_COMMUNITY): Payer: Self-pay | Admitting: *Deleted

## 2017-02-07 ENCOUNTER — Emergency Department (HOSPITAL_COMMUNITY): Payer: Self-pay

## 2017-02-07 ENCOUNTER — Emergency Department (HOSPITAL_COMMUNITY)
Admission: EM | Admit: 2017-02-07 | Discharge: 2017-02-07 | Disposition: A | Discharge status: Home / Self Care | Payer: Self-pay | Attending: Emergency Medicine | Admitting: Emergency Medicine

## 2017-02-07 DIAGNOSIS — Y999 Unspecified external cause status: Secondary | ICD-10-CM | POA: Insufficient documentation

## 2017-02-07 DIAGNOSIS — S2241XA Multiple fractures of ribs, right side, initial encounter for closed fracture: Secondary | ICD-10-CM | POA: Insufficient documentation

## 2017-02-07 DIAGNOSIS — Z79899 Other long term (current) drug therapy: Secondary | ICD-10-CM | POA: Insufficient documentation

## 2017-02-07 DIAGNOSIS — Y929 Unspecified place or not applicable: Secondary | ICD-10-CM | POA: Insufficient documentation

## 2017-02-07 DIAGNOSIS — Y9389 Activity, other specified: Secondary | ICD-10-CM | POA: Insufficient documentation

## 2017-02-07 DIAGNOSIS — X500XXA Overexertion from strenuous movement or load, initial encounter: Secondary | ICD-10-CM | POA: Insufficient documentation

## 2017-02-07 HISTORY — DX: Neoplasm of unspecified behavior of brain: D49.6

## 2017-02-07 MED ORDER — ACETAMINOPHEN 325 MG PO TABS
650.0000 mg | ORAL_TABLET | Freq: Once | ORAL | Status: AC
  Administered 2017-02-07: 650 mg via ORAL
  Filled 2017-02-07: qty 2

## 2017-02-07 MED ORDER — HYDROCODONE-ACETAMINOPHEN 5-325 MG PO TABS
2.0000 | ORAL_TABLET | Freq: Once | ORAL | Status: AC
  Administered 2017-02-07: 2 via ORAL
  Filled 2017-02-07: qty 2

## 2017-02-07 MED ORDER — IBUPROFEN 800 MG PO TABS
800.0000 mg | ORAL_TABLET | Freq: Once | ORAL | Status: AC
  Administered 2017-02-07: 800 mg via ORAL
  Filled 2017-02-07: qty 1

## 2017-02-07 MED ORDER — CYCLOBENZAPRINE HCL 10 MG PO TABS: 10.0000 mg | ORAL_TABLET | Freq: Three times a day (TID) | ORAL | 0 refills | Status: AC

## 2017-02-07 MED ORDER — IBUPROFEN 600 MG PO TABS: 600.0000 mg | ORAL_TABLET | Freq: Four times a day (QID) | ORAL | 0 refills | Status: AC

## 2017-02-07 MED ORDER — HYDROCODONE-ACETAMINOPHEN 5-325 MG PO TABS: 1.0000 | ORAL_TABLET | ORAL | 0 refills | Status: AC | PRN

## 2017-02-07 MED ORDER — DIAZEPAM 5 MG PO TABS
10.0000 mg | ORAL_TABLET | Freq: Once | ORAL | Status: AC
  Administered 2017-02-07: 10 mg via ORAL
  Filled 2017-02-07: qty 2

## 2017-02-07 NOTE — ED Notes (Signed)
Respiratory paged about incentive spirometry.

## 2017-02-07 NOTE — Discharge Instructions (Signed)
Your blood pressure is elevated at 155/104. The x-ray of your chest and ribs reveals 3 fractures on the right. Please use the incentive spirometry machine every 30 minutes to keep your lungs properly expanded. Please use Flexeril 3 times daily for spasm around the fracture area. Use ibuprofen with breakfast, lunch, dinner, and at bedtime. May use Norco for more severe pain. Flexeril and Norco may cause drowsiness. Please do not drive a vehicle, operating machinery, drink alcohol, or participate in activities requiring concentration when taking this medication. Please see the physicians at the [facility] for follow-up of your fractures.

## 2017-02-07 NOTE — ED Notes (Signed)
POC urine pregnancy test resulted NEGATIVE.

## 2017-02-07 NOTE — ED Triage Notes (Signed)
Pt needs interpreter. This nurse used Izora Gala (ID number [phone]).   Pt having pain in the middle her back that started 3 weeks ago.  Denies any injury. Denies any n/v/d. Pain worsens with movement.

## 2017-02-07 NOTE — ED Notes (Signed)
[place] interpretor #[id number] used for d/c instructions. Pt verbalized understanding of d/c instructions, follow up care, and prescription use.

## 2017-02-07 NOTE — ED Provider Notes (Signed)
[facility] DEPT Provider Note   CSN: [id number] Arrival date & time: 02/07/17  1627     History   Chief Complaint Chief Complaint  Patient presents with  . Back Pain    HPI Kathryn Green is a 36 y.o. female.  Patient is a 36 year old female who presents to the emergency department with a complaint of back area pain. Patient speaks limited [place]. The Spanish interpreter was used to interview and examine the patient. The patient states that approximately 3 weeks ago she started having pain in the middle of her back radiating to the flank area. She does a lot of lifting and pushing, but she does not recall falling, being hit, BUN any accidents. She's not had any difficulty with breathing, but states that it hurts when she takes a deep breath. It hurts even more when she moves in a certain position like getting in and out of bed for getting in and out of the car. No hemoptysis reported. No hematuria. No hematochezia reported. Patient presents at this time for assistance with her pain and discomfort.   The history is provided by the patient. The history is limited by a language barrier. A language interpreter was used.  Back Pain   This is a new problem. Episode onset: 3 weeks. The problem occurs hourly. The problem has been gradually worsening. The pain is associated with no known injury.    Past Medical History:  Diagnosis Date  . Brain tumor ([place])   . Thyroid disease     Patient Active Problem List   Diagnosis Date Noted  . Chronic allergic rhinitis 01/09/2017  . Nasal congestion 01/09/2017  . Rhinitis medicamentosa 01/09/2017    Past Surgical History:  Procedure Laterality Date  . APPENDECTOMY    . CESAREAN SECTION    . CHOLECYSTECTOMY      OB History    No data available       Home Medications    Prior to Admission medications   Medication Sig Start Date End Date Taking? Authorizing Provider  acetaminophen-codeine (TYLENOL #3) 300-30 MG tablet Take 1-2  tablets by mouth every 6 (six) hours as needed for moderate pain. Patient not taking: Reported on 01/09/2017 12/19/15   Lily Kocher, PA-C  amoxicillin (AMOXIL) 500 MG capsule Take 1 capsule (500 mg total) by mouth 3 (three) times daily. 12/19/15   Lily Kocher, PA-C  HYDROcodone-acetaminophen (NORCO) 7.5-325 MG per tablet Take 1 tablet by mouth every 4 (four) hours as needed. Patient not taking: Reported on 01/09/2017 10/21/14   Lily Kocher, PA-C  ibuprofen (ADVIL,MOTRIN) 600 MG tablet Take 1 tablet (600 mg total) by mouth every 6 (six) hours as needed. 12/19/15   Lily Kocher, PA-C  meloxicam (MOBIC) 15 MG tablet Take 1 tablet (15 mg total) by mouth daily. Patient not taking: Reported on 01/09/2017 10/21/14   Lily Kocher, PA-C    Family History No family history on file.  Social History Social History  Substance Use Topics  . Smoking status: Never Smoker  . Smokeless tobacco: Never Used  . Alcohol use No     Comment: SOMETIMES     Allergies   Patient has no known allergies.   Review of Systems Review of Systems  Musculoskeletal: Positive for back pain.  All other systems reviewed and are negative.    Physical Exam Updated Vital Signs BP (!) 155/104 Comment: taken twice for validation  Pulse 99   Temp 98.6 F (37 C) (Oral)   Resp 18  Ht 5\' 3"  (1.6 m)   Wt 113.4 kg   SpO2 99%   BMI 44.29 kg/m   Physical Exam  Constitutional: She is oriented to person, place, and time. She appears well-developed and well-nourished.  Non-toxic appearance.  HENT:  Head: Normocephalic.  Right Ear: Tympanic membrane and external ear normal.  Left Ear: Tympanic membrane and external ear normal.  Eyes: EOM and lids are normal. Pupils are equal, round, and reactive to light.  Neck: Normal range of motion. Neck supple. Carotid bruit is not present.  Cardiovascular: Normal rate, regular rhythm, normal heart sounds, intact distal pulses and normal pulses.   Pulmonary/Chest: Breath  sounds normal. No respiratory distress.      Abdominal: Soft. Bowel sounds are normal. There is no tenderness. There is no guarding.  Musculoskeletal: Normal range of motion.  Lymphadenopathy:       Head (right side): No submandibular adenopathy present.       Head (left side): No submandibular adenopathy present.    She has no cervical adenopathy.  Neurological: She is alert and oriented to person, place, and time. She has normal strength. No cranial nerve deficit or sensory deficit.  Skin: Skin is warm and dry.  Psychiatric: She has a normal mood and affect. Her speech is normal.  Nursing note and vitals reviewed.    ED Treatments / Results  Labs (all labs ordered are listed, but only abnormal results are displayed) Labs Reviewed  POC URINE PREG, ED    EKG  EKG Interpretation None       Radiology Dg Ribs Unilateral W/chest Right  Result Date: 02/07/2017 CLINICAL DATA:  Subacute onset of right posterior mid rib pain. Initial encounter. EXAM: RIGHT RIBS AND CHEST - 3+ VIEW COMPARISON:  CT of the chest performed 10/21/2014 FINDINGS: There are likely minimally displaced fractures of the right lateral fifth through seventh ribs. The lungs are mildly hypoexpanded but appear grossly clear. There is no evidence of focal opacification, pleural effusion or pneumothorax. The cardiomediastinal silhouette is borderline enlarged. No additional osseous abnormalities are seen. IMPRESSION: 1. Likely minimally displaced fractures of the right lateral fifth through seventh ribs. 2. Lungs mildly hypoexpanded but grossly clear. 3. Borderline cardiomegaly. Electronically Signed   By: Garald Balding M.D.   On: 02/07/2017 18:51    Procedures Procedures (including critical care time) [place] Patient presented to the emergency department with 3 weeks of mid back pain radiating to the right flank. The pulse oximetry is 97-99% on room air. Pain is easily reproduced with movement and  with palpation.  X-ray suggests possibly minimally displaced fractures of the right fifth, sixth, seventh rib. The lungs are mildly hypoexpanded, but grossly clear.  I discussed with the patient fractures in the vital signs in terms which he understands. Incentive spirometry has been ordered for the patient. I discussed with the patient how to brace her rib area and do cough and deep breathing. The patient will be treated with Flexeril, Norco, and ibuprofen. The patient is to follow-up with the [facility]  clinic for continued monitoring of her rib fractures. She will return to the emergency department if any emergent changes, problems, or concerns.  Medications Ordered in ED Medications  ibuprofen (ADVIL,MOTRIN) tablet 800 mg (800 mg Oral Given 02/07/17 1753)  acetaminophen (TYLENOL) tablet 650 mg (650 mg Oral Given 02/07/17 1753)     Initial Impression / Assessment and Plan / ED Course  I have reviewed the triage vital signs and the nursing notes.  Pertinent labs & imaging results that were available during my care of the patient were reviewed by me and considered in my medical decision making (see chart for details).       Final Clinical Impressions(s) / ED Diagnoses MDM Patient presents to the emergency department with back and flank area pain on the right. No history of falls, injury or trauma reported. No hemoptysis reported. No dysuria reported.  Urine pregnancy test is negative.  X-ray of the chest shows the lungs mildly hypoexpanded, but grossly clear. X-ray of the ribs shows likely minimally displaced fractures of the left lateral fifth, sixth, seventh ribs.  I've instructed the patient on the x-ray findings. The patient is fitted with the incentive spirometer and prescription for muscle relaxer and pain medication given to the patient. The patient will follow-up at the [facility] clinic for follow-up of her fractures and pain, as she does not have a primary physician at this time.  At  discharge, patient states that she would like to have something stronger for pain. She is going to call her boyfriend to come and pick her up.  8:10 PM. Awaiting patient's boyfriend to come  to the emergency room. Pt's ride came to the room, and medication given. Rx for flexeril, norco, and ibuprofen.   Final diagnoses:  Closed fracture of multiple ribs of right side, initial encounter    New Prescriptions New Prescriptions   CYCLOBENZAPRINE (FLEXERIL) 10 MG TABLET    Take 1 tablet (10 mg total) by mouth 3 (three) times daily.   HYDROCODONE-ACETAMINOPHEN (NORCO/VICODIN) 5-325 MG TABLET    Take 1 tablet by mouth every 4 (four) hours as needed.   IBUPROFEN (ADVIL,MOTRIN) 600 MG TABLET    Take 1 tablet (600 mg total) by mouth 4 (four) times daily.     Lily Kocher, PA-C 02/08/17 [place], [place], DO 02/11/17 1547

## 2017-02-07 NOTE — ED Notes (Signed)
Pt is awaiting ride to arrive before medication can be given.

## 2017-02-12 ENCOUNTER — Telehealth: Payer: Self-pay

## 2017-02-12 DIAGNOSIS — Z139 Encounter for screening, unspecified: Secondary | ICD-10-CM

## 2017-02-12 LAB — GLUCOSE, POCT (MANUAL RESULT ENTRY): POC Glucose: 89 mg/dl (ref 70–99)

## 2017-02-12 NOTE — Telephone Encounter (Signed)
After patient was here early today on 5/14, patient decided to leave due to sleepiness and pain. Nurses here at [name] were waiting on Russell return phone call.  Debbie returned call with appointment at the [facility] at 1400 on 5/15 to apply for the orange card. Also she would get help in connecting her to a primary care provider.   Follow-up call was made to patient with information and appointment given by [facility]. Patient said her boyfriend will take her. Department [facility] Social Services's address and number was given as well and the items she needs to bring to appointment. ID/ Passport, proof address, proof of income/notarized letter stating boyfriend helps her moinitarily, and bank statements.   Patient asked where she could get something notarized and told patient I would have to look for some places and call her back.  Also voiced to her that I would be cancelling her appointment at the [facility].   Annlee Glandon R. [place], LPN [phone]

## 2017-02-12 NOTE — Telephone Encounter (Signed)
After giving patient items needed for her appointment at the [facility], and coming across needing a notarized letter stating she get monitary help from her boyfriends, patient did not know who could notarize. I told her I could ask around and see what I could find.   After calling several places I wasn't able to find someone. I advised patient to try the post office, but patient voiced that she know someone.   Reminded patient of other items needed for her appointment tomorrow at 2 pm. Also if she is in any more increased pain, difficulty breathing call 911 or go to nearest hospital, also continue use of incentive spirometer.  Let patient know we are here tomorrow at 1 at the [facility] center  if she has anymore questions.   [place], [place] [phone]

## 2017-02-12 NOTE — Congregational Nurse Program (Signed)
Congregational Nurse Program Note  Date of Encounter: 02/12/2017  Past Medical History: Past Medical History:  Diagnosis Date  . Brain tumor ([place])   . Thyroid disease     Encounter Details:     CNP Questionnaire - 02/12/17 1146      Patient Demographics   Is this a new or existing patient? New   Patient is considered a/an Immigrant   Race Latino/Hispanic     Patient Assistance   Location of Patient [place]   Patient's financial/insurance status Self-Pay (Uninsured)   Uninsured Patient (Orange Card/Care Connects) No   Patient referred to apply for the following financial assistance [place] insecurities addressed Not Research scientist (physical sciences) No   Assistance securing medications No   Educational health offerings Diabetes;Navigating the healthcare system;Hypertension;Nutrition     Encounter Details   Primary purpose of visit Acute Illness/Condition Visit   Was an Emergency Department visit averted? No   Does patient have a medical provider? No   Patient referred to Clinic   Was a mental health screening completed? (GAINS tool) No   Does patient have dental issues? No   Does patient have vision issues? No   Does your patient have an abnormal blood pressure today? Yes   Since previous encounter, have you referred patient for abnormal blood pressure that resulted in a new diagnosis or medication change? No   Does your patient have an abnormal blood glucose today? No   Since previous encounter, have you referred patient for abnormal blood glucose that resulted in a new diagnosis or medication change? No   Was there a life-saving intervention made? No     Patient called in early this morning wanting to be seen. Stated she was at [facility] on May 9th, due to difficulty breathing. Once there she was told she had 3 fractured ribs. Patients says she does not know how it happened, said she could've fallen but isn't  certain. After verifying her primary residence and income, patient agreed to come in at 104.   Patient arrived, and after only being able to fill out minimal paper work patient looked drowsy and she stated it was because of the medications. Medications given to patient in hospital were Ibuprofen 600mg , cyclobenzaprine 10mg , hydrocodone/acetaminophen 5-325.   Help was given in filling encounter forms. Address stated she lived in [place], making it hard for nurses to connect her to a provider here in [place], with her circumstances of being undocumented and no income and not living in [place].  Lessie Dings, RN was contacted and advised [place] to call [facility] but they referred her to [place]. Appointment was made for June 1st at 10:15 for hospital follow-up and then make a follow-up appointment for a orange card.  After making sure if there were any bilingual staff at the clinic and how much was her first visit will be, Jackelyn Poling was phoned again to clarify some questions but it was taking more time and patient was sleeping in the patients room and saying she was hurting real bad  we asked her if she would like to stay until we got the call or go home and we called her. She decided to go home.  Before patient went home she was reminded to apply for [facility] discount. If pain worsens, difficulty breathing, SOB, call 911 or got to nearest hospital. Advised to use incentive spirometer as ordered.  Nurse will return phone call.  Zollie Clemence R. Teri Legacy LPN [phone]

## 2017-02-13 ENCOUNTER — Telehealth: Payer: Self-pay

## 2017-02-13 NOTE — Telephone Encounter (Signed)
Patient was here yesterday 02/12/17 and was referred into to the Department of Social Services to apply for the orange card. Appointment was made for today 02/13/17 at 2:00 pm.  Patient called [name] stating she has not been seen and wasn't sure if they knew she was there since she seen customers go in and out and she was still in the waiting room since 1:30.   Let patient know her appointment wasn't until 2:00 pm and it could still be a little while until she was called back, but would check into it for her and would let her know.   [name] R. [name] LPN [phone]

## 2017-02-13 NOTE — Telephone Encounter (Signed)
Patient was referred to DSS for orange card application on 04/10/61 at 2:00 pm.   After receiving a call from patient stating she has been waiting since 1:30 and worried to isn't going to get called back. I voiced to patient that is still early but will let her know what I am able to retrieve.  After making some calls, DSS worker voiced to me that it is still early and will give it 10/15 min and call Kathryn Green the [facility] card DSS worker.   Returned call to patient to give her the information I recieved and patient stated she is with Kathryn Green.    Kathryn Green R. Evani Shrider LPN [phone]

## 2017-02-14 ENCOUNTER — Telehealth: Payer: Self-pay

## 2017-02-14 NOTE — Telephone Encounter (Signed)
Patient was called as a Follow-up from her appointment at [place] on 5/15 at 1400 to apply for the orange card.  Patient stated she was able to receive the orange card, but doesn't know what she can use it for. Voiced to her that I am not familiar with the [facility] card but would be better for her to call Beola Cord the worker who helped with her application.   I also asked if she was able to get connected with a PCP. Patient stated she was told to call a number on the back of the orange card. Patient stated she has not called because she doesn't know if they speak spanish. Voiced to patient that I would be in her best interest to at least try and see if they have a bilingual person available. Patient agreed on calling.   Patient voiced that she is to take a hospital back into the office. She stated she doesn't not know when she can take it because she doesn't drive and her boyfriend will need to take her.   Patient was told that if I found out any information in regards to the [facility] card and what it is used for as well as if she can mail in the bill, I would get back to her.  Kathryn Green R. Kathryn Leis LPN [phone]

## 2017-03-02 ENCOUNTER — Inpatient Hospital Stay (INDEPENDENT_AMBULATORY_CARE_PROVIDER_SITE_OTHER): Payer: Self-pay | Admitting: Physician Assistant
